# Patient Record
Sex: Female | Born: 2005 | Race: White | Hispanic: Yes | Marital: Single | Smoking: Never smoker
Health system: Southern US, Community
[De-identification: ages and names within clinical notes are randomized; demographics above are authoritative.]

## PROBLEM LIST (undated history)

## (undated) DIAGNOSIS — T7840XA Allergy, unspecified, initial encounter: Secondary | ICD-10-CM

## (undated) DIAGNOSIS — H539 Unspecified visual disturbance: Secondary | ICD-10-CM

## (undated) HISTORY — DX: Allergy, unspecified, initial encounter: T78.40XA

## (undated) HISTORY — DX: Unspecified visual disturbance: H53.9

---
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
## 2024-01-01 DIAGNOSIS — M25531 Pain in right wrist: Secondary | ICD-10-CM | POA: Diagnosis not present

## 2024-01-09 DIAGNOSIS — M25512 Pain in left shoulder: Secondary | ICD-10-CM | POA: Diagnosis not present

## 2024-01-12 DIAGNOSIS — M25512 Pain in left shoulder: Secondary | ICD-10-CM | POA: Diagnosis not present

## 2024-01-26 ENCOUNTER — Encounter: Payer: Self-pay | Admitting: Neurology

## 2024-01-26 ENCOUNTER — Ambulatory Visit (INDEPENDENT_AMBULATORY_CARE_PROVIDER_SITE_OTHER): Payer: 59 | Admitting: Neurology

## 2024-01-26 VITALS — BP 122/74 | HR 61 | Ht 68.0 in | Wt 163.2 lb

## 2024-01-26 DIAGNOSIS — R519 Headache, unspecified: Secondary | ICD-10-CM | POA: Diagnosis not present

## 2024-01-26 DIAGNOSIS — G43709 Chronic migraine without aura, not intractable, without status migrainosus: Secondary | ICD-10-CM | POA: Diagnosis not present

## 2024-01-26 MED ORDER — EPINEPHRINE 0.3 MG/0.3ML IJ SOAJ: 0.3000 mg | INTRAMUSCULAR | 6 refills | Status: DC | PRN

## 2024-01-26 MED ORDER — NURTEC 75 MG PO TBDP: 75.0000 mg | ORAL_TABLET | Freq: Every day | ORAL | Status: DC | PRN

## 2024-01-26 MED ORDER — EMGALITY 120 MG/ML ~~LOC~~ SOAJ
240.0000 mg | Freq: Once | SUBCUTANEOUS | 0 refills | Status: AC
Start: 2024-01-26 — End: 2024-01-26

## 2024-01-26 NOTE — Patient Instructions (Addendum)
 New medications: Emgality monthly (Ajovy, Not Aimovig, Nurtec, Tempie Hoist, Qulipta) Older medications: Include Topiramate, Amitriptyline, propranolol, Sumatriptan, rizatriptan, cymbalta, effexor, candesartan  Galcanezumab Injection What is this medication? GALCANEZUMAB (gal ka NEZ ue mab) prevents migraines. It works by blocking a substance in the body that causes migraines. It may also be used to treat cluster headaches. It is a monoclonal antibody. This medicine may be used for other purposes; ask your health care provider or pharmacist if you have questions. COMMON BRAND NAME(S): Emgality What should I tell my care team before I take this medication? They need to know if you have any of these conditions: An unusual or allergic reaction to galcanezumab, other medications, foods, dyes, or preservatives Pregnant or trying to get pregnant Breast-feeding How should I use this medication? This medication is injected under the skin. You will be taught how to prepare and give it. Take it as directed on the prescription label. Keep taking it unless your care team tells you to stop. It is important that you put your used needles and syringes in a special sharps container. Do not put them in a trash can. If you do not have a sharps container, call your pharmacist or care team to get one. Talk to your care team about the use of this medication in children. Special care may be needed. Overdosage: If you think you have taken too much of this medicine contact a poison control center or emergency room at once. NOTE: This medicine is only for you. Do not share this medicine with others. What if I miss a dose? If you miss a dose, take it as soon as you can. If it is almost time for your next dose, take only that dose. Do not take double or extra doses. What may interact with this medication? Interactions are not expected. This list may not describe all possible interactions. Give your health care  provider a list of all the medicines, herbs, non-prescription drugs, or dietary supplements you use. Also tell them if you smoke, drink alcohol, or use illegal drugs. Some items may interact with your medicine. What should I watch for while using this medication? Visit your care team for regular checks on your progress. Tell your care team if your symptoms do not start to get better or if they get worse. What side effects may I notice from receiving this medication? Side effects that you should report to your care team as soon as possible: Allergic reactions or angioedema--skin rash, itching or hives, swelling of the face, eyes, lips, tongue, arms, or legs, trouble swallowing or breathing Side effects that usually do not require medical attention (report to your care team if they continue or are bothersome): Pain, redness, or irritation at injection site This list may not describe all possible side effects. Call your doctor for medical advice about side effects. You may report side effects to FDA at 1-800-FDA-1088. Where should I keep my medication? Keep out of the reach of children and pets. Store in a refrigerator or at room temperature between 20 and 25 degrees C (68 and 77 degrees F). Refrigeration (preferred): Store in the refrigerator. Do not freeze. Keep in the original container until you are ready to take it. Remove the dose from the carton about 30 minutes before it is time for you to use it. If the dose is not used, it may be stored in original container at room temperature for 7 days. Get rid of any unused medication after the expiration date.  Room Temperature: This medication may be stored at room temperature for up to 7 days. Keep it in the original container. Protect from light until time of use. If it is stored at room temperature, get rid of any unused medication after 7 days or after it expires, whichever is first. To get rid of medications that are no longer needed or have  expired: Take the medication to a medication take-back program. Check with your pharmacy or law enforcement to find a location. If you cannot return the medication, ask your pharmacist or care team how to get rid of this medication safely. NOTE: This sheet is a summary. It may not cover all possible information. If you have questions about this medicine, talk to your doctor, pharmacist, or health care provider.  2024 Elsevier/Gold Standard (2022-01-14 00:00:00)  Rimegepant Disintegrating Tablets What is this medication? RIMEGEPANT (ri ME je pant) prevents and treats migraines. It works by blocking a substance in the body that causes migraines. This medicine may be used for other purposes; ask your health care provider or pharmacist if you have questions. COMMON BRAND NAME(S): NURTEC ODT What should I tell my care team before I take this medication? They need to know if you have any of these conditions: Kidney disease Liver disease An unusual or allergic reaction to rimegepant, other medications, foods, dyes, or preservatives Pregnant or trying to get pregnant Breast-feeding How should I use this medication? Take this medication by mouth. Take it as directed on the prescription label. Leave the tablet in the sealed pack until you are ready to take it. With dry hands, open the pack and gently remove the tablet. If the tablet breaks or crumbles, throw it away. Use a new tablet. Place the tablet in the mouth and allow it to dissolve. Then, swallow it. Do not cut, crush, or chew this medication. You do not need water to take this medication. Talk to your care team about the use of this medication in children. Special care may be needed. Overdosage: If you think you have taken too much of this medicine contact a poison control center or emergency room at once. NOTE: This medicine is only for you. Do not share this medicine with others. What if I miss a dose? This does not apply. This medication is  not for regular use. What may interact with this medication? Certain medications for fungal infections, such as fluconazole, itraconazole Rifampin This list may not describe all possible interactions. Give your health care provider a list of all the medicines, herbs, non-prescription drugs, or dietary supplements you use. Also tell them if you smoke, drink alcohol, or use illegal drugs. Some items may interact with your medicine. What should I watch for while using this medication? Visit your care team for regular checks on your progress. Tell your care team if your symptoms do not start to get better or if they get worse. What side effects may I notice from receiving this medication? Side effects that you should report to your care team as soon as possible: Allergic reactions--skin rash, itching, hives, swelling of the face, lips, tongue, or throat Side effects that usually do not require medical attention (report to your care team if they continue or are bothersome): Nausea Stomach pain This list may not describe all possible side effects. Call your doctor for medical advice about side effects. You may report side effects to FDA at 1-800-FDA-1088. Where should I keep my medication? Keep out of the reach of children and pets.  Store at room temperature between 20 and 25 degrees C (68 and 77 degrees F). Get rid of any unused medication after the expiration date. To get rid of medications that are no longer needed or have expired: Take the medication to a medication take-back program. Check with your pharmacy or law enforcement to find a location. If you cannot return the medication, check the label or package insert to see if the medication should be thrown out in the garbage or flushed down the toilet. If you are not sure, ask your care team. If it is safe to put it in the trash, take the medication out of the container. Mix the medication with cat litter, dirt, coffee grounds, or other unwanted  substance. Seal the mixture in a bag or container. Put it in the trash. NOTE: This sheet is a summary. It may not cover all possible information. If you have questions about this medicine, talk to your doctor, pharmacist, or health care provider.  2024 Elsevier/Gold Standard (2022-01-09 00:00:00)

## 2024-01-26 NOTE — Progress Notes (Unsigned)
 [facility]    Provider:  Dr Lucia Gaskins Requesting Provider: Zola Button, Grayling Congress, * Primary Care Provider:  Zola Button, Grayling Congress, DO  CC:  migraines and headaches  HPI:  Rachel Vargas is a 18 y.o. female here as requested by Zola Button, Grayling Congress, * for Headaches was diagnosed with migraines even before concussion. Daily headaches. The pain can be unilateral behind and eye or in the back of her head, pressure or squeezing, throbbing/pounding, like a pingpong ball, photophobia/phonophobia/osmophobia, worsened after concussion in 08/2022, playing basketball, nausea, hurts to move, a dark quiet room helps, would get car sick in the car. 12 migraine days a month, lasts at least 4-24 hours and can be moderate to severe, sleep helps if she can sleep, no aura, no medication overuse ongoing at this frequency and severity for over a year.has vomited in the past. No other focal neurologic deficits, associated symptoms, inciting events or modifiable factors.He aunt has migraines. No other focal neurologic deficits, associated symptoms, inciting events or modifiable factors.  Reviewed notes, labs and imaging from outside physicians, which showed:  Aimovig and qulipta contraindicated due to severe issues with constipation.   MRI brain: 10/13/2023:  CLINICAL DATA:  Provided history: Concussion without loss of consciousness, subsequent encounter. New daily persistent headache. Head trauma, GCS = 15, severe headache.   EXAM: MRI HEAD WITHOUT CONTRAST   TECHNIQUE: Multiplanar, multiecho pulse sequences of the brain and surrounding structures were obtained without intravenous contrast.   COMPARISON:  None.   FINDINGS: Brain:   Cerebral volume is normal.   No cortical encephalomalacia is identified. No significant cerebral white matter disease.   There is no acute infarct.   No evidence of an intracranial mass.   No chronic intracranial blood products.   No extra-axial  fluid collection.   No midline shift.   Vascular: Maintained flow voids within the proximal large arterial vessels.   Skull and upper cervical spine: No focal worrisome marrow lesion.   Sinuses/Orbits: No mass or acute finding within the imaged orbits. Minimal mucosal thickening, and possible small mucous retention cyst, within the right maxillary sinus.   IMPRESSION: 1. Unremarkable non-contrast MRI appearance of the brain. No evidence of an acute intracranial abnormality. 2. Mild right maxillary sinus disease, as described.     Latest Ref Rng & Units 01/26/2024    9:24 AM 04/11/2016    2:38 PM  CBC  WBC 3.4 - 10.8 x10E3/uL 5.1  7.6   Hemoglobin 11.1 - 15.9 g/dL 16.1  09.6   Hematocrit 34.0 - 46.6 % 46.1  39.0   Platelets 150 - 450 x10E3/uL 291  301       Latest Ref Rng & Units 01/26/2024    9:24 AM 04/11/2016    2:38 PM  CMP  Glucose 70 - 99 mg/dL 80  82   BUN 6 - 20 mg/dL 12  13   Creatinine 0.45 - 1.00 mg/dL 4.09  8.11   Sodium 914 - 144 mmol/L 138  137   Potassium 3.5 - 5.2 mmol/L 4.3  4.2   Chloride 96 - 106 mmol/L 102  105   CO2 20 - 29 mmol/L 22  23   Calcium 8.7 - 10.2 mg/dL 9.6  9.3   Total Protein 6.0 - 8.5 g/dL 7.4  7.0   Total Bilirubin 0.0 - 1.2 mg/dL <7.8  0.4   Alkaline Phos 42 - 106 IU/L 83  209   AST 0 - 40 IU/L 26  29  ALT 0 - 32 IU/L 32  15      Review of Systems: Patient complains of symptoms per HPI as well as the following symptoms none. Pertinent negatives and positives per HPI. All others negative.   Social History   Socioeconomic History   Marital status: Single    Spouse name: Not on file   Number of children: Not on file   Years of education: Not on file   Highest education level: Not on file  Occupational History   Not on file  Tobacco Use   Smoking status: Never   Smokeless tobacco: Never  Vaping Use   Vaping status: Never Used  Substance and Sexual Activity   Alcohol use: No    Alcohol/week: 0.0 standard drinks of  alcohol   Drug use: No   Sexual activity: Never  Other Topics Concern   Not on file  Social History Narrative   Caffiene no   Middle child of 3, llives with parents   Basket ball at [employer]   Social Drivers of Health   Financial Resource Strain: Not on file  Food Insecurity: Not on file  Transportation Needs: Not on file  Physical Activity: Not on file  Stress: Not on file  Social Connections: Unknown (04/12/2022)   Received from [facility], [facility]   Social Network    Social Network: Not on file  Intimate Partner Violence: Unknown (03/05/2022)   [facility] from [facility], [facility]   HITS    Physically Hurt: Not on file    Insult or Talk Down To: Not on file    Threaten Physical Harm: Not on file    Scream or Curse: Not on file    Family History  Problem Relation Age of Onset   Asthma Mother        as a child   Hypertension Maternal Grandmother    Hypertension Maternal Grandfather    Hypertension Paternal Grandfather    Migraines Neg Hx     Past Medical History:  Diagnosis Date   Allergy    Frequent headaches 05/30/2023   Vision abnormalities    Wears corrective lenses    Patient Active Problem List   Diagnosis Date Noted   Abrasion of throat 05/30/2023   Frequent headaches 05/30/2023   Scapular dysfunction 09/10/2021   Right foot injury 12/05/2016   Abdominal pain, epigastric 04/18/2016   Rash and nonspecific skin eruption 04/18/2016   Easy bruising 04/11/2016   Viral URI with cough 08/25/2015   Left wrist pain 06/20/2015   Medial tibial stress syndrome 03/23/2015   Right elbow pain 05/10/2014    History reviewed. No pertinent surgical history.  Current Outpatient Medications  Medication Sig Dispense Refill   acetaminophen (TYLENOL) 500 MG tablet Take 500 mg by mouth every 8 (eight) hours as needed.     EPINEPHrine 0.3 mg/0.3 mL IJ SOAJ injection Inject 0.3 mg into the muscle as needed for anaphylaxis. 2 each 6    Galcanezumab-gnlm (EMGALITY) 120 MG/ML SOAJ Inject 120 mg into the skin every 30 (thirty) days. Plese use copay card: [id number]  PCN PDMI GRP [id number] ID [id number] EXP 12/01/2024 1.12 mL 11   ibuprofen (ADVIL) 200 MG tablet Take 200-400 mg by mouth every 6 (six) hours as needed.     Rimegepant Sulfate (NURTEC) 75 MG TBDP Take 1 tablet (75 mg total) by mouth daily as needed. Take one daily for next 8 days     No current facility-administered medications for this  visit.    Allergies as of 01/26/2024 - Review Complete 01/26/2024  Allergen Reaction Noted   Molds & smuts Other (See Comments) 10/19/2020   Pollen extract Other (See Comments) 10/19/2020   Wheat extract Other (See Comments) 09/23/2022    Vitals: BP 122/74 (Cuff Size: Normal)   Pulse 61   Ht 5\' 8"  (1.727 m)   Wt 163 lb 3.2 oz (74 kg)   BMI 24.81 kg/m  Last Weight:  Wt Readings from Last 1 Encounters:  01/26/24 163 lb 3.2 oz (74 kg) (91%, Z= 1.33)*   * Growth percentiles are based on CDC (Girls, 2-20 Years) data.   Last Height:   Ht Readings from Last 1 Encounters:  01/26/24 5\' 8"  (1.727 m) (93%, Z= 1.48)*   * Growth percentiles are based on CDC (Girls, 2-20 Years) data.     Physical exam: Exam: Gen: NAD, conversant, well nourised, obese, well groomed                     CV: RRR, no MRG. No Carotid Bruits. No peripheral edema, warm, nontender Eyes: Conjunctivae clear without exudates or hemorrhage  Neuro: Detailed Neurologic Exam  Speech:    Speech is normal; fluent and spontaneous with normal comprehension.  Cognition:    The patient is oriented to person, place, and time;     recent and remote memory intact;     language fluent;     normal attention, concentration,     fund of knowledge Cranial Nerves:    The pupils are equal, round, and reactive to light. The fundi are normal and spontaneous venous pulsations are present. Visual fields are full to finger confrontation. Extraocular movements are  intact. Trigeminal sensation is intact and the muscles of mastication are normal. The face is symmetric. The palate elevates in the midline. Hearing intact. Voice is normal. Shoulder shrug is normal. The tongue has normal motion without fasciculations.   Coordination:    Normal finger to nose right arm and heel to shin. Left arm in a sling   Gait:    Heel-toe and tandem gait are normal.   Motor Observation:    No asymmetry, no atrophy, and no involuntary movements noted. Tone:    Normal muscle tone.    Posture:    Posture is normal. normal erect    Strength:    Strength is V/V in the upper and lower limbs. Left arm in a sling     Sensation: intact to LT     Reflex Exam:  DTR's:    Deep tendon reflexes in the upper and lower extremities are normal bilaterally.   Toes:    The toes are downgoing bilaterally.   Clonus:    Clonus is absent.    Assessment/Plan:  Chronic migraines. Start [employer] She has had 2 concussions, discussed risk of permanent concussion symptoms with multiple concussions.   Discussed:  New medications: Emgality monthly (Ajovy, Not Aimovig, Nurtec, Tempie Hoist, Qulipta) Older medications: Include Topiramate, Amitriptyline, propranolol, Sumatriptan, rizatriptan, cymbalta, effexor, candesartan  Orders Placed This Encounter  Procedures   CBC with Differential/Platelets   Comprehensive metabolic panel   TSH Rfx on Abnormal to Free T4   Meds ordered this encounter  Medications   Galcanezumab-gnlm (EMGALITY) 120 MG/ML SOAJ    Sig: Inject 240 mg into the skin once for 1 dose. Loading dose. Please use copay card: [id number]  PCN PDMI GRP [id number] ID [id number] EXP 12/01/2024    Dispense:  2 mL  Refill:  0    Loading dose. Please use copay card: BINM [id number]  PCN PDMI GRP [id number] ID [id number] EXP 12/01/2024   Rimegepant Sulfate (NURTEC) 75 MG TBDP    Sig: Take 1 tablet (75 mg total) by mouth daily as needed. Take one daily for next 8 days    EPINEPHrine 0.3 mg/0.3 mL IJ SOAJ injection    Sig: Inject 0.3 mg into the muscle as needed for anaphylaxis.    Dispense:  2 each    Refill:  6    Please give her longest expiration date she does not use often   Galcanezumab-gnlm (EMGALITY) 120 MG/ML SOAJ    Sig: Inject 120 mg into the skin every 30 (thirty) days. Plese use copay card: [id number] [id number]  PCN PDMI GRP [id number] ID [id number] EXP 12/01/2024    Dispense:  1.12 mL    Refill:  11    BIN [id number]  PCN PDMI GRP [id number] ID [id number] EXP 12/01/2024    Cc: Donato Schultz, *,  Donato Schultz, DO  Naomie Dean, MD  [facility] [address] [place], [place] [address]  Phone [phone] Fax [phone]

## 2024-01-27 ENCOUNTER — Encounter: Payer: Self-pay | Admitting: Neurology

## 2024-01-27 LAB — CBC WITH DIFFERENTIAL/PLATELET
Basophils Absolute: 0 10*3/uL (ref 0.0–0.2)
Basos: 0 %
EOS (ABSOLUTE): 0.1 10*3/uL (ref 0.0–0.4)
Eos: 1 %
Hematocrit: 46.1 % (ref 34.0–46.6)
Hemoglobin: 15 g/dL (ref 11.1–15.9)
Immature Grans (Abs): 0 10*3/uL (ref 0.0–0.1)
Immature Granulocytes: 0 %
Lymphocytes Absolute: 1.7 10*3/uL (ref 0.7–3.1)
Lymphs: 34 %
MCH: 28.7 pg (ref 26.6–33.0)
MCHC: 32.5 g/dL (ref 31.5–35.7)
MCV: 88 fL (ref 79–97)
Monocytes Absolute: 0.5 10*3/uL (ref 0.1–0.9)
Monocytes: 9 %
Neutrophils Absolute: 2.8 10*3/uL (ref 1.4–7.0)
Neutrophils: 56 %
Platelets: 291 10*3/uL (ref 150–450)
RBC: 5.23 x10E6/uL (ref 3.77–5.28)
RDW: 12.2 % (ref 11.7–15.4)
WBC: 5.1 10*3/uL (ref 3.4–10.8)

## 2024-01-27 LAB — COMPREHENSIVE METABOLIC PANEL
ALT: 32 [IU]/L (ref 0–32)
AST: 26 [IU]/L (ref 0–40)
Albumin: 4.7 g/dL (ref 4.0–5.0)
Alkaline Phosphatase: 83 [IU]/L (ref 42–106)
BUN/Creatinine Ratio: 15 (ref 9–23)
BUN: 12 mg/dL (ref 6–20)
Bilirubin Total: <0.2 mg/dL (ref 0.0–1.2)
CO2: 22 mmol/L (ref 20–29)
Calcium: 9.6 mg/dL (ref 8.7–10.2)
Chloride: 102 mmol/L (ref 96–106)
Creatinine, Ser: 0.81 mg/dL (ref 0.57–1.00)
Globulin, Total: 2.7 g/dL (ref 1.5–4.5)
Glucose: 80 mg/dL (ref 70–99)
Potassium: 4.3 mmol/L (ref 3.5–5.2)
Sodium: 138 mmol/L (ref 134–144)
Total Protein: 7.4 g/dL (ref 6.0–8.5)
eGFR: 108 mL/min/{1.73_m2} (ref >59)

## 2024-01-27 LAB — TSH RFX ON ABNORMAL TO FREE T4: TSH: 1.32 u[IU]/mL (ref 0.450–4.500)

## 2024-01-28 MED ORDER — EMGALITY 120 MG/ML ~~LOC~~ SOAJ: 120.0000 mg | SUBCUTANEOUS | 11 refills | Status: DC

## 2024-01-29 DIAGNOSIS — M25312 Other instability, left shoulder: Secondary | ICD-10-CM | POA: Diagnosis not present

## 2024-04-27 DIAGNOSIS — M25312 Other instability, left shoulder: Secondary | ICD-10-CM | POA: Diagnosis not present

## 2024-05-04 NOTE — Patient Instructions (Signed)
 SURGICAL WAITING ROOM VISITATION  Patients having surgery or a procedure may have no more than 2 support people in the waiting area - these visitors may rotate.    Children under the age of 35 must have an adult with them who is not the patient.  Visitors with respiratory illnesses are discouraged from visiting and should remain at home.  If the patient needs to stay at the hospital during part of their recovery, the visitor guidelines for inpatient rooms apply. Pre-op nurse will coordinate an appropriate time for 1 support person to accompany patient in pre-op.  This support person may not rotate.    Please refer to the [facility] website for the visitor guidelines for Inpatients (after your surgery is over and you are in a regular room).       Your procedure is scheduled on: 05/14/24   Report to [facility] Main Entrance    Report to admitting at 10:15  AM   Call this number if you have problems the morning of surgery [phone]   Do not eat food :After Midnight.   After Midnight you may have the following liquids until 9:30 AM DAY OF SURGERY  Water Non-Citrus Juices (without pulp, NO RED-Apple, White grape, White cranberry) Black Coffee (NO MILK/CREAM OR CREAMERS, sugar ok)  Clear Tea (NO MILK/CREAM OR CREAMERS, sugar ok) regular and decaf                             Plain Jell-O (NO RED)                                           Fruit ices (not with fruit pulp, NO RED)                                     Popsicles (NO RED)                                                               Sports drinks like Gatorade (NO RED)                  The day of surgery:  Drink ONE (1) Pre-Surgery Clear Ensure  at 9:30 AM the morning of surgery. Drink in one sitting. Do not sip.  This drink was given to you during your hospital  pre-op appointment visit. Nothing else to drink after completing the  Pre-Surgery Clear Ensure      Oral Hygiene is also important to reduce your  risk of infection.                                    Remember - BRUSH YOUR TEETH THE MORNING OF SURGERY WITH YOUR REGULAR TOOTHPASTE   Stop all vitamins and herbal supplements 7 days before surgery.   Take these medicines the morning of surgery with A SIP OF WATER: Tylenol if needed.             You may  not have any metal on your body including hair pins, jewelry, and body piercing             Do not wear make-up, lotions, powders, perfumes/cologne, or deodorant  Do not wear nail polish including gel and S&S, artificial/acrylic nails, or any other type of covering on natural nails including finger and toenails. If you have artificial nails, gel coating, etc. that needs to be removed by a nail salon please have this removed prior to surgery or surgery may need to be canceled/ delayed if the surgeon/ anesthesia feels like they are unable to be safely monitored.   Do not shave  48 hours prior to surgery.    Do not bring valuables to the hospital. [place] IS NOT             RESPONSIBLE   FOR VALUABLES.   Contacts, glasses, dentures or bridgework may not be worn into surgery.  DO NOT BRING YOUR HOME MEDICATIONS TO THE HOSPITAL. PHARMACY WILL DISPENSE MEDICATIONS LISTED ON YOUR MEDICATION LIST TO YOU DURING YOUR ADMISSION IN THE HOSPITAL!    Patients discharged on the day of surgery will not be allowed to drive home.  Someone NEEDS to stay with you for the first 24 hours after anesthesia.   Special Instructions: Bring a copy of your healthcare power of attorney and living will documents the day of surgery if you haven't scanned them before.              Please read over the following fact sheets you were given: IF YOU HAVE QUESTIONS ABOUT YOUR PRE-OP INSTRUCTIONS PLEASE CALL [phone] Rachel Vargas   If you received a COVID test during your pre-op visit  it is requested that you wear a mask when out in public, stay away from anyone that may not be feeling well and notify your surgeon if you  develop symptoms. If you test positive for Covid or have been in contact with anyone that has tested positive in the last 10 days please notify you surgeon.    [place] - Preparing for Surgery Before surgery, you can play an important role.  Because skin is not sterile, your skin needs to be as free of germs as possible.  You can reduce the number of germs on your skin by washing with CHG (chlorahexidine gluconate) soap before surgery.  CHG is an antiseptic cleaner which kills germs and bonds with the skin to continue killing germs even after washing. Please DO NOT use if you have an allergy to CHG or antibacterial soaps.  If your skin becomes reddened/irritated stop using the CHG and inform your nurse when you arrive at Short Stay. Do not shave (including legs and underarms) for at least 48 hours prior to the first CHG shower.  You may shave your face/neck.  Please follow these instructions carefully:  1.  Shower with CHG Soap the night before surgery and the  morning of surgery.  2.  If you choose to wash your hair, wash your hair first as usual with your normal  shampoo.  3.  After you shampoo, rinse your hair and body thoroughly to remove the shampoo.                             4.  Use CHG as you would any other liquid soap.  You can apply chg directly to the skin and wash.  Gently with a scrungie or clean  washcloth.  5.  Apply the CHG Soap to your body ONLY FROM THE NECK DOWN.   Do   not use on face/ open                           Wound or open sores. Avoid contact with eyes, ears mouth and   genitals (private parts).                       Wash face,  Genitals (private parts) with your normal soap.             6.  Wash thoroughly, paying special attention to the area where your    surgery  will be performed.  7.  Thoroughly rinse your body with warm water from the neck down.  8.  DO NOT shower/wash with your normal soap after using and rinsing off the CHG Soap.                9.  Pat  yourself dry with a clean towel.            10.  Wear clean pajamas.            11.  Place clean sheets on your bed the night of your first shower and do not  sleep with pets. Day of Surgery : Do not apply any lotions/deodorants the morning of surgery.  Please wear clean clothes to the hospital/surgery center.  FAILURE TO FOLLOW THESE INSTRUCTIONS MAY RESULT IN THE CANCELLATION OF YOUR SURGERY  PATIENT SIGNATURE_________________________________  NURSE SIGNATURE__________________________________  ________________________________________________________________________

## 2024-05-04 NOTE — Progress Notes (Signed)
 COVID Vaccine received:  [x]  No []  Yes Date of any COVID positive Test in last 90 days: no PCP - Roel Clarity DO Cardiologist - n/a  Chest x-ray -  EKG -   Stress Test -  ECHO -  Cardiac Cath -   Bowel Prep - [x]  No  []   Yes ______  Pacemaker / ICD device [x]  No []  Yes   Spinal Cord Stimulator:[x]  No []  Yes       History of Sleep Apnea? [x]  No []  Yes   CPAP used?- [x]  No []  Yes    Does the patient monitor blood sugar?          [x]  No []  Yes  []  N/A  Patient has: [x]  NO Hx DM   []  Pre-DM                 []  DM1  []   DM2 Does patient have a [employer] or [employer]? []  No []  Yes   Fasting Blood Sugar Ranges-  Checks Blood Sugar _____ times a day  GLP1 agonist / usual dose - no GLP1 instructions:  SGLT-2 inhibitors / usual dose - no SGLT-2 instructions:   Blood Thinner / Instructions:no Aspirin Instructions:no  Comments:   Activity level: Patient is able to climb a flight of stairs without difficulty; [x]  No CP  [x]  No SOB,    Patient can  perform ADLs without assistance.   Anesthesia review: 2 concussions in past. Daily headaches. Did see Neurology 01/26/24.  Patient denies shortness of breath, fever, cough and chest pain at PAT appointment.  Patient verbalized understanding and agreement to the Pre-Surgical Instructions that were given to them at this PAT appointment. Patient was also educated of the need to review these PAT instructions again prior to his/her surgery.I reviewed the appropriate phone numbers to call if they have any and questions or concerns.

## 2024-05-07 ENCOUNTER — Other Ambulatory Visit: Payer: Self-pay

## 2024-05-07 ENCOUNTER — Telehealth: Payer: Self-pay | Admitting: Neurology

## 2024-05-07 ENCOUNTER — Encounter (HOSPITAL_COMMUNITY)
Admission: RE | Admit: 2024-05-07 | Discharge: 2024-05-07 | Disposition: A | Discharge status: Home / Self Care | Source: Ambulatory Visit | Attending: Orthopedic Surgery | Admitting: Orthopedic Surgery

## 2024-05-07 VITALS — BP 130/82 | HR 57 | Temp 98.2°F | Resp 16 | Ht 68.0 in | Wt 165.0 lb

## 2024-05-07 DIAGNOSIS — F0781 Postconcussional syndrome: Secondary | ICD-10-CM

## 2024-05-07 DIAGNOSIS — Z01818 Encounter for other preprocedural examination: Secondary | ICD-10-CM

## 2024-05-07 DIAGNOSIS — Z01812 Encounter for preprocedural laboratory examination: Secondary | ICD-10-CM | POA: Insufficient documentation

## 2024-05-07 DIAGNOSIS — R519 Headache, unspecified: Secondary | ICD-10-CM

## 2024-05-07 LAB — CBC
HCT: 43.8 % (ref 36.0–46.0)
Hemoglobin: 14.3 g/dL (ref 12.0–15.0)
MCH: 28.3 pg (ref 26.0–34.0)
MCHC: 32.6 g/dL (ref 30.0–36.0)
MCV: 86.7 fL (ref 80.0–100.0)
Platelets: 273 10*3/uL (ref 150–400)
RBC: 5.05 MIL/uL (ref 3.87–5.11)
RDW: 11.7 % (ref 11.5–15.5)
WBC: 5 10*3/uL (ref 4.0–10.5)
nRBC: 0 % (ref 0.0–0.2)

## 2024-05-07 NOTE — Telephone Encounter (Signed)
 Pt mother called in regards to Daughter is having Pre op Surgery  this morning at 9:00 am and will need to use anesthesia and Parent want to know would this effect Daughter .

## 2024-05-10 NOTE — Addendum Note (Signed)
 Addended by: Santana Cue A on: 05/10/2024 04:46 PM   Modules accepted: Orders

## 2024-05-10 NOTE — Telephone Encounter (Signed)
 LVM for mom (checked dpr) call back concerning anesthesia and effects it may have on patient

## 2024-05-10 NOTE — Telephone Encounter (Addendum)
 Spoke to mom (checked DPR) Informed mother per Dr Tresia Fruit please talk to anesthesiologists regarding  side effects of anesthesia . Mom asked about referral for patient post concussion injury Per dr Tresia Fruit will  send referral to [facility] on Aging and Rehabilitation. . Made mom  aware will take 7-14 days busniess days for insurance approval and center will call them for an appointment,mom expressed understanding and thanked me for calling . Forward to [place] [facility] in phone room to send referral for patient

## 2024-05-10 NOTE — Addendum Note (Signed)
 Addended by: Dasie Chancellor B on: 05/10/2024 04:51 PM   Modules accepted: Orders

## 2024-05-11 ENCOUNTER — Telehealth: Payer: Self-pay | Admitting: Neurology

## 2024-05-11 NOTE — Telephone Encounter (Signed)
 Referral for Rehabilitation Faxed  to [facility] on Aging and Rehabilitation   for Post Concussion.  [facility] on Aging and Rehabilitation  Phone # [phone] Faxed # [phone]

## 2024-05-13 NOTE — Telephone Encounter (Signed)
 LVM for dad to call back (checked DPR) Per Dr Tresia Fruit she [facility] her  [facility]  if [facility] knows a [facility] that specializes  in concussion program/concussion clinic of his choice Please let Dr Tresia Fruit know and we will send the referral .

## 2024-05-13 NOTE — Telephone Encounter (Addendum)
 Patient father called in regarding referral to [facility] center. Father is wanting a referral to a more specialized concussion program/concussion clinic. States the [facility] is not really for that? He has looked at other things and thinks she needs something more specifically for concussions to help with her headaches and dizziness that is not therapy specifically? I'm not really sure what exactly he is looking for for patient, but he would like a call back to discuss.

## 2024-05-14 ENCOUNTER — Other Ambulatory Visit: Payer: Self-pay

## 2024-05-14 ENCOUNTER — Ambulatory Visit (HOSPITAL_BASED_OUTPATIENT_CLINIC_OR_DEPARTMENT_OTHER): Admitting: Anesthesiology

## 2024-05-14 ENCOUNTER — Ambulatory Visit (HOSPITAL_COMMUNITY)
Admission: RE | Admit: 2024-05-14 | Discharge: 2024-05-14 | Disposition: A | Discharge status: Home / Self Care | Attending: Orthopedic Surgery | Admitting: Orthopedic Surgery

## 2024-05-14 ENCOUNTER — Encounter (HOSPITAL_COMMUNITY)
Admission: RE | Disposition: A | Discharge status: Home / Self Care | Payer: Self-pay | Source: Home / Self Care | Attending: Orthopedic Surgery

## 2024-05-14 ENCOUNTER — Encounter (HOSPITAL_COMMUNITY): Payer: Self-pay | Admitting: Orthopedic Surgery

## 2024-05-14 ENCOUNTER — Ambulatory Visit (HOSPITAL_COMMUNITY): Payer: Self-pay | Admitting: Physician Assistant

## 2024-05-14 DIAGNOSIS — S43491A Other sprain of right shoulder joint, initial encounter: Secondary | ICD-10-CM | POA: Diagnosis not present

## 2024-05-14 DIAGNOSIS — M25312 Other instability, left shoulder: Secondary | ICD-10-CM | POA: Diagnosis not present

## 2024-05-14 DIAGNOSIS — X58XXXA Exposure to other specified factors, initial encounter: Secondary | ICD-10-CM | POA: Diagnosis not present

## 2024-05-14 DIAGNOSIS — S43431A Superior glenoid labrum lesion of right shoulder, initial encounter: Secondary | ICD-10-CM | POA: Diagnosis not present

## 2024-05-14 DIAGNOSIS — S42292A Other displaced fracture of upper end of left humerus, initial encounter for closed fracture: Secondary | ICD-10-CM | POA: Diagnosis not present

## 2024-05-14 DIAGNOSIS — S43432A Superior glenoid labrum lesion of left shoulder, initial encounter: Secondary | ICD-10-CM | POA: Diagnosis not present

## 2024-05-14 DIAGNOSIS — Z87828 Personal history of other (healed) physical injury and trauma: Secondary | ICD-10-CM | POA: Insufficient documentation

## 2024-05-14 DIAGNOSIS — G8918 Other acute postprocedural pain: Secondary | ICD-10-CM | POA: Diagnosis not present

## 2024-05-14 LAB — POCT PREGNANCY, URINE: Preg Test, Ur: NEGATIVE

## 2024-05-14 SURGERY — REPAIR, SHOULDER, ARTHROSCOPIC, BANKART
Anesthesia: General | Site: Shoulder | Laterality: Left

## 2024-05-14 MED ORDER — EPINEPHRINE PF 1 MG/ML IJ SOLN
INTRAMUSCULAR | Status: AC
  Filled 2024-05-14: qty 1

## 2024-05-14 MED ORDER — OXYCODONE HCL 5 MG PO TABS: 5.0000 mg | ORAL_TABLET | Freq: Once | ORAL | Status: DC | PRN

## 2024-05-14 MED ORDER — OXYCODONE HCL 5 MG PO TABS: 5.0000 mg | ORAL_TABLET | ORAL | 0 refills | Status: AC | PRN

## 2024-05-14 MED ORDER — BUPIVACAINE HCL (PF) 0.5 % IJ SOLN
INTRAMUSCULAR | Status: DC | PRN
  Administered 2024-05-14: 15 mL via PERINEURAL

## 2024-05-14 MED ORDER — SUGAMMADEX SODIUM 200 MG/2ML IV SOLN
INTRAVENOUS | Status: DC | PRN
  Administered 2024-05-14: 200 mg via INTRAVENOUS

## 2024-05-14 MED ORDER — LACTATED RINGERS IV SOLN: INTRAVENOUS | Status: DC

## 2024-05-14 MED ORDER — DROPERIDOL 2.5 MG/ML IJ SOLN: 0.6250 mg | Freq: Once | INTRAMUSCULAR | Status: DC | PRN

## 2024-05-14 MED ORDER — PROPOFOL 10 MG/ML IV BOLUS
INTRAVENOUS | Status: AC
  Filled 2024-05-14: qty 20

## 2024-05-14 MED ORDER — ROCURONIUM BROMIDE 10 MG/ML (PF) SYRINGE
PREFILLED_SYRINGE | INTRAVENOUS | Status: AC
  Filled 2024-05-14: qty 10

## 2024-05-14 MED ORDER — ONDANSETRON 4 MG PO TBDP: 4.0000 mg | ORAL_TABLET | Freq: Three times a day (TID) | ORAL | 0 refills | Status: AC | PRN

## 2024-05-14 MED ORDER — ONDANSETRON HCL 4 MG/2ML IJ SOLN: 4.0000 mg | Freq: Once | INTRAMUSCULAR | Status: DC | PRN

## 2024-05-14 MED ORDER — CEFAZOLIN SODIUM-DEXTROSE 2-4 GM/100ML-% IV SOLN
2.0000 g | INTRAVENOUS | Status: AC
  Administered 2024-05-14: 2 g via INTRAVENOUS
  Filled 2024-05-14: qty 100

## 2024-05-14 MED ORDER — LIDOCAINE HCL (CARDIAC) PF 100 MG/5ML IV SOSY
PREFILLED_SYRINGE | INTRAVENOUS | Status: DC | PRN
  Administered 2024-05-14: 60 mg via INTRAVENOUS

## 2024-05-14 MED ORDER — HYDROMORPHONE HCL 1 MG/ML IJ SOLN: 0.2500 mg | INTRAMUSCULAR | Status: DC | PRN

## 2024-05-14 MED ORDER — FENTANYL CITRATE (PF) 100 MCG/2ML IJ SOLN
INTRAMUSCULAR | Status: DC | PRN
  Administered 2024-05-14: 100 ug via INTRAVENOUS

## 2024-05-14 MED ORDER — ROCURONIUM BROMIDE 100 MG/10ML IV SOLN
INTRAVENOUS | Status: DC | PRN
Start: 2024-05-14 — End: 2024-05-14
  Administered 2024-05-14: 60 mg via INTRAVENOUS

## 2024-05-14 MED ORDER — FENTANYL CITRATE PF 50 MCG/ML IJ SOSY
50.0000 ug | PREFILLED_SYRINGE | INTRAMUSCULAR | Status: DC
  Administered 2024-05-14: 50 ug via INTRAVENOUS
  Filled 2024-05-14: qty 2

## 2024-05-14 MED ORDER — CHLORHEXIDINE GLUCONATE 0.12 % MT SOLN
15.0000 mL | Freq: Once | OROMUCOSAL | Status: AC
  Administered 2024-05-14: 15 mL via OROMUCOSAL

## 2024-05-14 MED ORDER — PROPOFOL 10 MG/ML IV BOLUS
INTRAVENOUS | Status: DC | PRN
  Administered 2024-05-14: 180 mg via INTRAVENOUS

## 2024-05-14 MED ORDER — EPINEPHRINE 1 MG/ML IJ SOLN
INTRAMUSCULAR | Status: DC | PRN
  Administered 2024-05-14: 1 mg

## 2024-05-14 MED ORDER — FENTANYL CITRATE (PF) 100 MCG/2ML IJ SOLN
INTRAMUSCULAR | Status: AC
Start: 2024-05-14 — End: 2024-05-14
  Filled 2024-05-14: qty 2

## 2024-05-14 MED ORDER — KETOROLAC TROMETHAMINE 30 MG/ML IJ SOLN
INTRAMUSCULAR | Status: AC
  Filled 2024-05-14: qty 1

## 2024-05-14 MED ORDER — ORAL CARE MOUTH RINSE: 15.0000 mL | Freq: Once | OROMUCOSAL | Status: AC

## 2024-05-14 MED ORDER — MIDAZOLAM HCL 2 MG/2ML IJ SOLN
INTRAMUSCULAR | Status: AC
  Filled 2024-05-14: qty 2

## 2024-05-14 MED ORDER — KETOROLAC TROMETHAMINE 15 MG/ML IJ SOLN
INTRAMUSCULAR | Status: DC | PRN
  Administered 2024-05-14: 15 mg via INTRAVENOUS

## 2024-05-14 MED ORDER — BUPIVACAINE LIPOSOME 1.3 % IJ SUSP
INTRAMUSCULAR | Status: DC | PRN
  Administered 2024-05-14: 10 mL via PERINEURAL

## 2024-05-14 MED ORDER — OXYCODONE HCL 5 MG/5ML PO SOLN: 5.0000 mg | Freq: Once | ORAL | Status: DC | PRN

## 2024-05-14 MED ORDER — LIDOCAINE HCL (PF) 2 % IJ SOLN
INTRAMUSCULAR | Status: AC
  Filled 2024-05-14: qty 5

## 2024-05-14 MED ORDER — SODIUM CHLORIDE 0.9 % IR SOLN
Status: DC | PRN
  Administered 2024-05-14: 6000 mL

## 2024-05-14 MED ORDER — ONDANSETRON HCL 4 MG/2ML IJ SOLN
INTRAMUSCULAR | Status: DC | PRN
Start: 2024-05-14 — End: 2024-05-14
  Administered 2024-05-14: 4 mg via INTRAVENOUS

## 2024-05-14 MED ORDER — MIDAZOLAM HCL 2 MG/2ML IJ SOLN
1.0000 mg | INTRAMUSCULAR | Status: DC
  Administered 2024-05-14 (×2): 1 mg via INTRAVENOUS
  Filled 2024-05-14: qty 2

## 2024-05-14 MED ORDER — DEXMEDETOMIDINE HCL IN NACL 80 MCG/20ML IV SOLN
INTRAVENOUS | Status: DC | PRN
  Administered 2024-05-14: 10 ug via INTRAVENOUS

## 2024-05-14 SURGICAL SUPPLY — 58 items
ANCHOR SUT 1.8 FIBERTAK SB KL (Anchor) IMPLANT
ANCHOR SUT FBRTK 2.6 SP #5 (Anchor) IMPLANT
BAG COUNTER SPONGE SURGICOUNT (BAG) ×1 IMPLANT
BLADE SHAVER TORPEDO 4X13 (MISCELLANEOUS) IMPLANT
BURR OVAL 8 FLU 4.0X13 (MISCELLANEOUS) ×1 IMPLANT
CANNULA 5.75X7 CRYSTAL CLEAR (CANNULA) IMPLANT
CANNULA 5.75X71 LONG (CANNULA) IMPLANT
CANNULA TWIST IN 8.25X7CM (CANNULA) IMPLANT
CONNECTOR 5 IN 1 STRAIGHT STRL (MISCELLANEOUS) IMPLANT
COVER FOOTSWITCH UNIV (MISCELLANEOUS) IMPLANT
DISSECTOR 3.8MM X 13CM (MISCELLANEOUS) IMPLANT
DRAPE INCISE IOBAN 66X45 STRL (DRAPES) IMPLANT
DRAPE SHEET LG 3/4 BI-LAMINATE (DRAPES) ×1 IMPLANT
DRAPE STERI 35X30 U-POUCH (DRAPES) ×1 IMPLANT
DRAPE SURG 17X23 STRL (DRAPES) ×1 IMPLANT
DRAPE SURG ORHT 6 SPLT 77X108 (DRAPES) ×2 IMPLANT
DRAPE U-SHAPE 47X51 STRL (DRAPES) ×1 IMPLANT
DURAPREP 26ML APPLICATOR (WOUND CARE) ×1 IMPLANT
ELECT PENCIL ROCKER SW 15FT (MISCELLANEOUS) ×1 IMPLANT
ELECT REM PT RETURN 15FT ADLT (MISCELLANEOUS) IMPLANT
FIBERSTICK 2 (SUTURE) IMPLANT
FILTER STRAW (MISCELLANEOUS) IMPLANT
GAUZE PAD ABD 8X10 STRL (GAUZE/BANDAGES/DRESSINGS) ×3 IMPLANT
GAUZE SPONGE 4X4 12PLY STRL (GAUZE/BANDAGES/DRESSINGS) ×1 IMPLANT
GLOVE BIO SURGEON STRL SZ7.5 (GLOVE) ×2 IMPLANT
GLOVE BIOGEL PI IND STRL 8 (GLOVE) ×2 IMPLANT
GOWN STRL REUS W/ TWL XL LVL3 (GOWN DISPOSABLE) ×2 IMPLANT
KIT ANCHOR FBRTK 2.6 STR (KITS) IMPLANT
KIT BASIN OR (CUSTOM PROCEDURE TRAY) ×1 IMPLANT
KIT CVD SPEAR FBRTK 1.8 DRILL (KITS) IMPLANT
KIT TURNOVER KIT A (KITS) ×1 IMPLANT
LASSO 90 CVE QUICKPAS (DISPOSABLE) IMPLANT
MANIFOLD NEPTUNE II (INSTRUMENTS) ×1 IMPLANT
NDL 1/2 CIR CATGUT .05X1.09 (NEEDLE) IMPLANT
NDL HD SCORPION MEGA LOADER (NEEDLE) IMPLANT
NDL SAFETY ECLIPSE 18X1.5 (NEEDLE) IMPLANT
NEEDLE 1/2 CIR CATGUT .05X1.09 (NEEDLE) IMPLANT
PACK ARTHROSCOPY WL (CUSTOM PROCEDURE TRAY) ×1 IMPLANT
PROBE BIPOLAR ATHRO 135MM 90D (MISCELLANEOUS) IMPLANT
SLEEVE ARM SUSPENSION SYSTEM (MISCELLANEOUS) ×1 IMPLANT
SLING S3 LATERAL DISP (MISCELLANEOUS) ×1 IMPLANT
SLING ULTRA II L (ORTHOPEDIC SUPPLIES) IMPLANT
SLING ULTRA III MED (ORTHOPEDIC SUPPLIES) IMPLANT
SPONGE T-LAP 4X18 ~~LOC~~+RFID (SPONGE) IMPLANT
STRIP CLOSURE SKIN 1/2X4 (GAUZE/BANDAGES/DRESSINGS) IMPLANT
SUT MNCRL AB 3-0 PS2 27 (SUTURE) ×1 IMPLANT
SUT PDS AB 0 CT1 36 (SUTURE) IMPLANT
SUT TIGER TAPE 7 IN WHITE (SUTURE) IMPLANT
SUT VIC AB 0 CT1 36 (SUTURE) ×1 IMPLANT
SUT VIC AB 2-0 CT1 TAPERPNT 27 (SUTURE) IMPLANT
SUTURE FIBERWR #2 38 T-5 BLUE (SUTURE) IMPLANT
SYR 27GX1/2 1ML LL SAFETY (SYRINGE) IMPLANT
TAPE CLOTH SURG 6X10 NS LF (GAUZE/BANDAGES/DRESSINGS) ×1 IMPLANT
TOWEL OR 17X26 10 PK STRL BLUE (TOWEL DISPOSABLE) ×1 IMPLANT
TUBING ARTHROSCOPY IRRIG 16FT (MISCELLANEOUS) ×2 IMPLANT
TUBING CONNECTING 10 (TUBING) ×2 IMPLANT
WAND ABLATOR APOLLO I90 (BUR) ×1 IMPLANT
WATER STERILE IRR 500ML POUR (IV SOLUTION) ×1 IMPLANT

## 2024-05-14 NOTE — Transfer of Care (Signed)
 Immediate Anesthesia Transfer of Care Note  Patient: Rachel Vargas  Procedure(s) Performed: REPAIR, SHOULDER, ARTHROSCOPIC, BANKART, REMPLISSAGE (Left: Shoulder)  Patient Location: PACU  Anesthesia Type:General  Level of Consciousness: drowsy  Airway & Oxygen Therapy: Patient Spontanous Breathing and Patient connected to nasal cannula oxygen  Post-op Assessment: Report given to RN and Post -op Vital signs reviewed and stable  Post vital signs: Reviewed and stable  Last Vitals:  Vitals Value Taken Time  BP 116/63 05/14/24 14:15  Temp 36.2 C 05/14/24 14:11  Pulse 91 05/14/24 14:26  Resp 20 05/14/24 14:26  SpO2 100 % 05/14/24 14:26  Vitals shown include unfiled device data.  Last Pain:  Vitals:   05/14/24 1411  TempSrc:   PainSc: 0-No pain         Complications: No notable events documented.

## 2024-05-14 NOTE — Anesthesia Procedure Notes (Signed)
 Anesthesia Regional Block: Interscalene brachial plexus block   Pre-Anesthetic Checklist: , timeout performed,  Correct Patient, Correct Site, Correct Laterality,  Correct Procedure, Correct Position, site marked,  Risks and benefits discussed,  Surgical consent,  Pre-op evaluation,  At surgeon's request and post-op pain management  Laterality: Left  Prep: chloraprep       Needles:  Injection technique: Single-shot  Needle Type: Echogenic Stimulator Needle     Needle Length: 10cm  Needle Gauge: 21   Needle insertion depth: 6 cm   Additional Needles:   Procedures:,,,, ultrasound used (permanent image in chart),,   Motor weakness within 5 minutes.  Narrative:  Start time: 05/14/2024 11:33 AM End time: 05/14/2024 11:38 AM Injection made incrementally with aspirations every 5 mL.  Performed by: Personally  Anesthesiologist: Tura Gaines, MD  Additional Notes: Timeout performed. Patient sedated. Relevant anatomy ID'd using US . Incremental 2-5ml injection of LA with frequent aspiration. Patient tolerated procedure well.

## 2024-05-14 NOTE — Anesthesia Procedure Notes (Addendum)
 Procedure Name: Intubation Date/Time: 05/14/2024 12:46 PM  Performed by: Linard Reno, CRNAPre-anesthesia Checklist: Patient identified, Emergency Drugs available, Suction available and Patient being monitored Patient Re-evaluated:Patient Re-evaluated prior to induction Oxygen Delivery Method: Circle System Utilized Preoxygenation: Pre-oxygenation with 100% oxygen Induction Type: IV induction Ventilation: Mask ventilation without difficulty Laryngoscope Size: Miller and 2 Grade View: Grade I Tube type: Oral Tube size: 7.0 mm Number of attempts: 1 Airway Equipment and Method: Stylet and Oral airway Placement Confirmation: ETT inserted through vocal cords under direct vision, positive ETCO2 and breath sounds checked- equal and bilateral Secured at: 21 cm Tube secured with: Tape Dental Injury: Teeth and Oropharynx as per pre-operative assessment

## 2024-05-14 NOTE — Discharge Instructions (Addendum)
Orthopedic surgery discharge instructions:  -Maintain postoperative bandages for 3 days.  You may remove these bandages on post op day 3 and begin showering at that time.  Please do not submerge underwater.  -Maintain your arm in sling at all times.  You should only remove for showering and getting dressed.  No lifting with the operative arm.  -For mild to moderate pain use Tylenol and Advil in alternating fashion around-the-clock.  For breakthrough pain use oxycodone as necessary.  -Please apply ice to the right shoulder for 20-30 minutes out of each hour that you are awake.  Do this around-the-clock for the first 3 days from surgery.  -Follow-up in 2 weeks for routine postoperative check.  

## 2024-05-14 NOTE — Anesthesia Preprocedure Evaluation (Addendum)
 Anesthesia Evaluation  Patient identified by MRN, date of birth, ID band Patient awake    Reviewed: Allergy & Precautions, NPO status , Patient's Chart, lab work & pertinent test results  Airway Mallampati: I       Dental no notable dental hx. (+) Teeth Intact, Dental Advisory Given   Pulmonary neg pulmonary ROS   Pulmonary exam normal breath sounds clear to auscultation       Cardiovascular negative cardio ROS Normal cardiovascular exam Rhythm:Regular Rate:Normal     Neuro/Psych  Headaches Hx/o concussion last year with HA's since that time. MRI negative for intracranial pathology 10/13/23  negative psych ROS   GI/Hepatic negative GI ROS, Neg liver ROS,,,  Endo/Other  negative endocrine ROS    Renal/GU   negative genitourinary   Musculoskeletal Left shoulder instability    Abdominal   Peds  Hematology negative hematology ROS (+) MTHFR gene mutation   Anesthesia Other Findings   Reproductive/Obstetrics negative OB ROS                              Anesthesia Physical Anesthesia Plan  ASA: 1  Anesthesia Plan: General   Post-op Pain Management: Regional block* and Minimal or no pain anticipated   Induction: Intravenous  PONV Risk Score and Plan: 4 or greater and Treatment may vary due to age or medical condition, Ondansetron and Dexamethasone  Airway Management Planned: Oral ETT  Additional Equipment: None  Intra-op Plan:   Post-operative Plan: Extubation in OR  Informed Consent: I have reviewed the patients History and Physical, chart, labs and discussed the procedure including the risks, benefits and alternatives for the proposed anesthesia with the patient or authorized representative who has indicated his/her understanding and acceptance.     Dental advisory given  Plan Discussed with: CRNA and Anesthesiologist  Anesthesia Plan Comments:          Anesthesia  Quick Evaluation

## 2024-05-14 NOTE — H&P (Signed)
 ORTHOPAEDIC H and P  REQUESTING PHYSICIAN: Janeth Medicus, MD  PCP:  Crecencio Dodge, Candida Chalk, DO  Chief Complaint: Left shoulder instability  HPI: Rachel Vargas is a 18 y.o. female who complains of left shoulder chronic instability here today for arthroscopic repair.  No new complaints.  Past Medical History:  Diagnosis Date   Allergy    Frequent headaches 05/30/2023   Vision abnormalities    Wears corrective lenses   History reviewed. No pertinent surgical history. Social History   Socioeconomic History   Marital status: Single    Spouse name: Not on file   Number of children: Not on file   Years of education: Not on file   Highest education level: Not on file  Occupational History   Not on file  Tobacco Use   Smoking status: Never   Smokeless tobacco: Never  Vaping Use   Vaping status: Never Used  Substance and Sexual Activity   Alcohol use: No    Alcohol/week: 0.0 standard drinks of alcohol   Drug use: No   Sexual activity: Never  Other Topics Concern   Not on file  Social History Narrative   Caffiene no   Middle child of 3, llives with parents   Basket ball at [employer]   Social Drivers of Health   Financial Resource Strain: Not on file  Food Insecurity: Not on file  Transportation Needs: Not on file  Physical Activity: Not on file  Stress: Not on file  Social Connections: Unknown (04/12/2022)   Received from [employer]   Social Network    Social Network: Not on file   Family History  Problem Relation Age of Onset   Asthma Mother        as a child   Hypertension Maternal Grandmother    Hypertension Maternal Grandfather    Hypertension Paternal Grandfather    Migraines Neg Hx    Allergies  Allergen Reactions   Molds & Smuts Other (See Comments)    Per mother   Pollen Extract Other (See Comments)    Per mother   Wheat Extract Other (See Comments)   Prior to Admission medications   Medication Sig Start Date End Date  Taking? Authorizing Provider  acetaminophen (TYLENOL) 500 MG tablet Take 500 mg by mouth every 6 (six) hours as needed (pain).   Yes [provider]  clindamycin (CLINDAGEL) 1 % gel Apply 1 Application topically in the morning.   Yes [provider]  tretinoin (RETIN-A) 0.025 % cream Apply 1 application  topically at bedtime.   Yes [provider]  ibuprofen  (ADVIL ) 200 MG tablet Take 200-400 mg by mouth every 8 (eight) hours as needed (pain/headache.).    [provider]   No results found.  Positive ROS: All other systems have been reviewed and were otherwise negative with the exception of those mentioned in the HPI and as above.  Physical Exam: General: Alert, no acute distress Cardiovascular: No pedal edema Respiratory: No cyanosis, no use of accessory musculature GI: No organomegaly, abdomen is soft and non-tender Skin: No lesions in the area of chief complaint Neurologic: Sensation intact distally Psychiatric: Patient is competent for consent with normal mood and affect Lymphatic: No axillary or cervical lymphadenopathy  MUSCULOSKELETAL: Left upper extremity is warm and well-perfused with no open wounds or lesions.  Assessment: 1.  Left shoulder chronic anterior instability 2.  Left shoulder Bankart tear 3.  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  We discussed moving ahead with arthroscopic Bankart repair as well as remplissage for arthroscopic stabilization.  Discussed the risk and benefits of the procedure which include but are not limited to bleeding, infection, damage to surrounding nerves and vessels, stiffness, failure of repairs, need for future surgery, development of arthritis, as well as the risk of anesthesia.  She has provided informed consent.  Discharge home postop from PACU.    Janeth Medicus, MD Cell [phone]     05/14/2024 11:15 AM

## 2024-05-14 NOTE — Brief Op Note (Signed)
 05/14/2024  1:53 PM  PATIENT:  Poonam Woehrle  18 y.o. female  PRE-OPERATIVE DIAGNOSIS:  Left shoulder instability  POST-OPERATIVE DIAGNOSIS:  Left shoulder instability  PROCEDURE:  Procedure(s): REPAIR, SHOULDER, ARTHROSCOPIC, BANKART, REMPLISSAGE (Left)  SURGEON:  Surgeons and Role:    * Janeth Medicus, MD - Primary  PHYSICIAN ASSISTANT: Kevan mcClung, PA-C    ANESTHESIA:   regional and general  EBL:  20 cc  BLOOD ADMINISTERED:none  DRAINS: none   LOCAL MEDICATIONS USED:  NONE  SPECIMEN:  No Specimen  DISPOSITION OF SPECIMEN:  N/A  COUNTS:  YES  TOURNIQUET:  * No tourniquets in log *  DICTATION: .Note written in EPIC  PLAN OF CARE: Discharge to home after PACU  PATIENT DISPOSITION:  PACU - hemodynamically stable.   Delay start of Pharmacological VTE agent (>24hrs) due to surgical blood loss or risk of bleeding: not applicable

## 2024-05-14 NOTE — Anesthesia Postprocedure Evaluation (Signed)
 Anesthesia Post Note  Patient: Rachel Vargas  Procedure(s) Performed: REPAIR, SHOULDER, ARTHROSCOPIC, BANKART, REMPLISSAGE (Left: Shoulder)     Patient location during evaluation: PACU Anesthesia Type: General Level of consciousness: awake and alert and oriented Pain management: pain level controlled Vital Signs Assessment: post-procedure vital signs reviewed and stable Respiratory status: spontaneous breathing, nonlabored ventilation and respiratory function stable Cardiovascular status: blood pressure returned to baseline and stable Postop Assessment: no apparent nausea or vomiting Anesthetic complications: no   No notable events documented.  Last Vitals:  Vitals:   05/14/24 1445 05/14/24 1502  BP: 120/64 (P) 125/74  Pulse: 64 (P) 67  Resp: 12 (P) 20  Temp:  (P) 36.6 C  SpO2: 98% (P) 98%    Last Pain:  Vitals:   05/14/24 1502  TempSrc:   PainSc: (P) 0-No pain                 Christol Thetford A.

## 2024-05-14 NOTE — Op Note (Signed)
 05/14/2024     PATIENT:  Rachel Vargas     PRE-OPERATIVE DIAGNOSIS:  Left shoulder instability   POST-OPERATIVE DIAGNOSIS:  1.  Left shoulder instability, anterior traumatic 2.  Left shoulder Bankart tear 3.  Left shoulder posterior humeral head Hill-Sachs lesion 4.  Left shoulder type II SLAP tear   PROCEDURE:   1.  Left shoulder arthroscopic remplissage (rotator cuff tenodesis) 2.  Left shoulder arthroscopic Bankart repair 3.  Left shoulder arthroscopic SLAP repair   SURGEON:  Janeth Medicus, MD   PHYSICIAN ASSISTANT: Karyl Paget, PA-C   Assistant attestation:   PA Mcclung present and scrubbed for the entire procedure.   ANESTHESIA:   General plus interscalene   ESTIMATED BLOOD LOSS: 20cc   PREOPERATIVE INDICATIONS:  Rachel Vargas is a 18 yo female with a diagnosis of Left shoulder instability who failed conservative measures and elected for surgical management.  She has had 3 anterior dislocation episodes of the left shoulder.   The risks benefits and alternatives were discussed with the patient preoperatively including but not limited to the risks of infection, bleeding, nerve injury, cardiopulmonary complications, the need for revision surgery, among others, and the patient was willing to proceed.   OPERATIVE IMPLANTS:  Arthrex 2.6 mm knotless fiber tack x 2.   Arthrex 1.8 mm knotless fiber tack x 4 for Bankart repair   Arthrex 1.8 mm knotless fiber tack x 1 for SLAP repair   OPERATIVE FINDINGS: Broad and wide Hill-Sachs lesion noted that appeared chronic in nature on the posterior aspect of the humeral head.  This was an on track lesion.  She also have clear anterior-inferior Bankart labral tearing as well as extension to about the 12 o'clock position of this left shoulder consistent with SLAP tear.  No Bone loss anterior inferior glenoid.       OPERATIVE PROCEDURE: The patient was brought to the operating room and placed in the supine  position. General anesthesia was administered. IV antibiotics were given. General anesthesia was administered.    The upper extremity was examined and found to be grossly unstable particularly to anterior testing. The upper extremity was prepped and draped in the usual sterile fashion. The patient was in a semilateral decubitus position.  Time out was performed. Diagnostic arthroscopy was carried out the above-named findings.    I placed 2 anterior cannulas, one just off the superior boarder of the subscapularis and one in the superolateral aspect of the rotator interval utilizing spinal needle localization, and then mobilized the labrum off of the medial neck of the glenoid with the spatula.  I then prepared the neck of the glenoid with a shaver/rasp to optimize healing, while still preserving the anterior bone stock.  The labrum had excellent mobility.      We first began with the remplissage anchor placement for later tenodesis at the conclusion of the labrum repair.  While viewing from the anterior superolateral portal and working from the posterior portal percutaneous fashion we introduced 2 separate 2.6 mm knotless fiber tack anchors into the Hill-Sachs defect.  Prior to doing so, we biologically prepared the bony bed of the Hill-Sachs lesion with motorized shaver and bur.  Next, we placed the anchors 1 superior into the Hill-Sachs lesion through the infraspinatus tendon, and then through a separate location more inferior through the infraspinatus another knotless 2.6 mm fiber tack was placed.  These were snapped in place and with later be secured into the Hill-Sachs bed  at the conclusion of the case.   We then turned our attention back to the anterior inferior labrum and the Bankart tear.  This had previously been biologically prepared with motorized shaver as well as freer elevator to ensure good reduction of labrum.  We began at the 6 o'clock position with our first 1.8 mm knotless fiber tack.  We  utilized there antegrade suture lasso to perform a good capsulorrhaphy as well as Bankart labrum repair.  We worked from the inferior position to create a good [place] shift as well as Chad.  We then used a knotless mechanism to cinch this back to bone.  This step was repeated at the 7:00, 8:00, 9:00 oclock positions.  This created an excellent anterior bumper.     Next, we probed the superior labral anchor of the biceps and found this to be quite unstable.  There was patulous labrum there.  We trimmed some of this could have noted rolloff of the superior labrum.  Thus we have a separate incision we percutaneously placed an anchor right at the 11 o'clock position.  We then used the same 1.8 mm knotless fiber tack anchor in this position.  We passed with a suture lasso through the labrum placed biceps anchor, and then secured this with the mechanism.   Lastly, went back to the remplissage and completed the tenodesis while passing the working suture from each respective anchor through to the knotless mechanism of the other such that we created a double pulley technique to compress tendon through the Hill-Sachs bone bed.   Excellent soft tissue restoration of tension was achieved, restoring the labrum bumper, and the humeral head was noted to be centered on the glenoi , and the arthroscopic cannulas were removed, and the portals closed with Monocryl followed by Steri-Strips and sterile gauze. The patient was awakened and returned to the PACU in stable and satisfactory condition. There were no complications and the patient tolerated the procedure well.  All counts were correct.   Disposition:  The patient will be nonweightbearing with an abduction sling to the operative extremity.  She may begin scapular retractions and elbow hand and wrist range motion as tolerated.  She will begin physical therapy in 1 week.  I will see them back in the office in 2 weeks for a wound check.

## 2024-05-17 ENCOUNTER — Encounter (HOSPITAL_COMMUNITY): Payer: Self-pay | Admitting: Orthopedic Surgery

## 2024-05-20 NOTE — Telephone Encounter (Signed)
 Pt father  was returning call  . Informed father that Nurse was reaching out to see if he had any other facilities  for pt referral to be sent to . Father states he  has two places he will call  them and give us  a call back

## 2024-08-11 DIAGNOSIS — R531 Weakness: Secondary | ICD-10-CM | POA: Diagnosis not present

## 2024-08-11 DIAGNOSIS — M75122 Complete rotator cuff tear or rupture of left shoulder, not specified as traumatic: Secondary | ICD-10-CM | POA: Diagnosis not present

## 2024-08-16 DIAGNOSIS — M75122 Complete rotator cuff tear or rupture of left shoulder, not specified as traumatic: Secondary | ICD-10-CM | POA: Diagnosis not present

## 2024-08-16 DIAGNOSIS — R531 Weakness: Secondary | ICD-10-CM | POA: Diagnosis not present

## 2024-08-18 DIAGNOSIS — R531 Weakness: Secondary | ICD-10-CM | POA: Diagnosis not present

## 2024-08-18 DIAGNOSIS — M75122 Complete rotator cuff tear or rupture of left shoulder, not specified as traumatic: Secondary | ICD-10-CM | POA: Diagnosis not present

## 2024-08-23 DIAGNOSIS — M75122 Complete rotator cuff tear or rupture of left shoulder, not specified as traumatic: Secondary | ICD-10-CM | POA: Diagnosis not present

## 2024-08-23 DIAGNOSIS — R531 Weakness: Secondary | ICD-10-CM | POA: Diagnosis not present

## 2024-08-25 DIAGNOSIS — R531 Weakness: Secondary | ICD-10-CM | POA: Diagnosis not present

## 2024-08-25 DIAGNOSIS — M75122 Complete rotator cuff tear or rupture of left shoulder, not specified as traumatic: Secondary | ICD-10-CM | POA: Diagnosis not present

## 2024-08-30 DIAGNOSIS — M75122 Complete rotator cuff tear or rupture of left shoulder, not specified as traumatic: Secondary | ICD-10-CM | POA: Diagnosis not present

## 2024-08-30 DIAGNOSIS — R531 Weakness: Secondary | ICD-10-CM | POA: Diagnosis not present

## 2024-09-01 DIAGNOSIS — R531 Weakness: Secondary | ICD-10-CM | POA: Diagnosis not present

## 2024-09-01 DIAGNOSIS — M75122 Complete rotator cuff tear or rupture of left shoulder, not specified as traumatic: Secondary | ICD-10-CM | POA: Diagnosis not present

## 2024-09-06 DIAGNOSIS — M75122 Complete rotator cuff tear or rupture of left shoulder, not specified as traumatic: Secondary | ICD-10-CM | POA: Diagnosis not present

## 2024-09-06 DIAGNOSIS — R531 Weakness: Secondary | ICD-10-CM | POA: Diagnosis not present

## 2024-09-08 DIAGNOSIS — M75122 Complete rotator cuff tear or rupture of left shoulder, not specified as traumatic: Secondary | ICD-10-CM | POA: Diagnosis not present

## 2024-09-08 DIAGNOSIS — R531 Weakness: Secondary | ICD-10-CM | POA: Diagnosis not present

## 2024-09-15 DIAGNOSIS — M75122 Complete rotator cuff tear or rupture of left shoulder, not specified as traumatic: Secondary | ICD-10-CM | POA: Diagnosis not present

## 2024-09-15 DIAGNOSIS — R531 Weakness: Secondary | ICD-10-CM | POA: Diagnosis not present

## 2024-09-21 DIAGNOSIS — M75122 Complete rotator cuff tear or rupture of left shoulder, not specified as traumatic: Secondary | ICD-10-CM | POA: Diagnosis not present

## 2024-09-21 DIAGNOSIS — R531 Weakness: Secondary | ICD-10-CM | POA: Diagnosis not present

## 2024-09-30 DIAGNOSIS — M75122 Complete rotator cuff tear or rupture of left shoulder, not specified as traumatic: Secondary | ICD-10-CM | POA: Diagnosis not present

## 2024-09-30 DIAGNOSIS — R531 Weakness: Secondary | ICD-10-CM | POA: Diagnosis not present

## 2024-10-05 DIAGNOSIS — R531 Weakness: Secondary | ICD-10-CM | POA: Diagnosis not present

## 2024-10-07 DIAGNOSIS — Z8352 Family history of ear disorders: Secondary | ICD-10-CM | POA: Diagnosis not present

## 2024-10-07 DIAGNOSIS — H938X3 Other specified disorders of ear, bilateral: Secondary | ICD-10-CM | POA: Diagnosis not present

## 2024-10-12 DIAGNOSIS — Z9889 Other specified postprocedural states: Secondary | ICD-10-CM | POA: Diagnosis not present

## 2024-10-13 DIAGNOSIS — M9902 Segmental and somatic dysfunction of thoracic region: Secondary | ICD-10-CM | POA: Diagnosis not present

## 2024-10-13 DIAGNOSIS — M9901 Segmental and somatic dysfunction of cervical region: Secondary | ICD-10-CM | POA: Diagnosis not present

## 2024-10-13 DIAGNOSIS — M9905 Segmental and somatic dysfunction of pelvic region: Secondary | ICD-10-CM | POA: Diagnosis not present

## 2024-10-13 DIAGNOSIS — M9903 Segmental and somatic dysfunction of lumbar region: Secondary | ICD-10-CM | POA: Diagnosis not present

## 2024-10-13 DIAGNOSIS — M9908 Segmental and somatic dysfunction of rib cage: Secondary | ICD-10-CM | POA: Diagnosis not present

## 2024-10-13 DIAGNOSIS — R519 Headache, unspecified: Secondary | ICD-10-CM | POA: Diagnosis not present

## 2024-10-14 DIAGNOSIS — M75122 Complete rotator cuff tear or rupture of left shoulder, not specified as traumatic: Secondary | ICD-10-CM | POA: Diagnosis not present

## 2024-10-14 DIAGNOSIS — R531 Weakness: Secondary | ICD-10-CM | POA: Diagnosis not present

## 2024-10-18 DIAGNOSIS — M9905 Segmental and somatic dysfunction of pelvic region: Secondary | ICD-10-CM | POA: Diagnosis not present

## 2024-10-18 DIAGNOSIS — M9903 Segmental and somatic dysfunction of lumbar region: Secondary | ICD-10-CM | POA: Diagnosis not present

## 2024-10-18 DIAGNOSIS — M9901 Segmental and somatic dysfunction of cervical region: Secondary | ICD-10-CM | POA: Diagnosis not present

## 2024-10-18 DIAGNOSIS — R519 Headache, unspecified: Secondary | ICD-10-CM | POA: Diagnosis not present

## 2024-10-18 DIAGNOSIS — M9908 Segmental and somatic dysfunction of rib cage: Secondary | ICD-10-CM | POA: Diagnosis not present

## 2024-10-18 DIAGNOSIS — M9902 Segmental and somatic dysfunction of thoracic region: Secondary | ICD-10-CM | POA: Diagnosis not present

## 2024-10-19 DIAGNOSIS — R531 Weakness: Secondary | ICD-10-CM | POA: Diagnosis not present

## 2024-10-19 DIAGNOSIS — M75122 Complete rotator cuff tear or rupture of left shoulder, not specified as traumatic: Secondary | ICD-10-CM | POA: Diagnosis not present

## 2024-10-20 DIAGNOSIS — M9901 Segmental and somatic dysfunction of cervical region: Secondary | ICD-10-CM | POA: Diagnosis not present

## 2024-10-20 DIAGNOSIS — M9908 Segmental and somatic dysfunction of rib cage: Secondary | ICD-10-CM | POA: Diagnosis not present

## 2024-10-20 DIAGNOSIS — M9903 Segmental and somatic dysfunction of lumbar region: Secondary | ICD-10-CM | POA: Diagnosis not present

## 2024-10-20 DIAGNOSIS — M9902 Segmental and somatic dysfunction of thoracic region: Secondary | ICD-10-CM | POA: Diagnosis not present

## 2024-10-20 DIAGNOSIS — M9905 Segmental and somatic dysfunction of pelvic region: Secondary | ICD-10-CM | POA: Diagnosis not present

## 2024-10-20 DIAGNOSIS — R519 Headache, unspecified: Secondary | ICD-10-CM | POA: Diagnosis not present

## 2024-10-22 DIAGNOSIS — M75122 Complete rotator cuff tear or rupture of left shoulder, not specified as traumatic: Secondary | ICD-10-CM | POA: Diagnosis not present

## 2024-10-22 DIAGNOSIS — R531 Weakness: Secondary | ICD-10-CM | POA: Diagnosis not present

## 2024-10-25 DIAGNOSIS — M9901 Segmental and somatic dysfunction of cervical region: Secondary | ICD-10-CM | POA: Diagnosis not present

## 2024-10-25 DIAGNOSIS — R519 Headache, unspecified: Secondary | ICD-10-CM | POA: Diagnosis not present

## 2024-10-25 DIAGNOSIS — M9903 Segmental and somatic dysfunction of lumbar region: Secondary | ICD-10-CM | POA: Diagnosis not present

## 2024-10-25 DIAGNOSIS — M9902 Segmental and somatic dysfunction of thoracic region: Secondary | ICD-10-CM | POA: Diagnosis not present

## 2024-10-25 DIAGNOSIS — M9908 Segmental and somatic dysfunction of rib cage: Secondary | ICD-10-CM | POA: Diagnosis not present

## 2024-10-25 DIAGNOSIS — M9905 Segmental and somatic dysfunction of pelvic region: Secondary | ICD-10-CM | POA: Diagnosis not present

## 2024-10-26 DIAGNOSIS — R531 Weakness: Secondary | ICD-10-CM | POA: Diagnosis not present

## 2024-10-26 DIAGNOSIS — M75122 Complete rotator cuff tear or rupture of left shoulder, not specified as traumatic: Secondary | ICD-10-CM | POA: Diagnosis not present

## 2024-11-01 DIAGNOSIS — R531 Weakness: Secondary | ICD-10-CM | POA: Diagnosis not present

## 2024-11-01 DIAGNOSIS — M75122 Complete rotator cuff tear or rupture of left shoulder, not specified as traumatic: Secondary | ICD-10-CM | POA: Diagnosis not present

## 2024-11-04 DIAGNOSIS — Z1329 Encounter for screening for other suspected endocrine disorder: Secondary | ICD-10-CM | POA: Diagnosis not present

## 2024-11-04 DIAGNOSIS — E559 Vitamin D deficiency, unspecified: Secondary | ICD-10-CM | POA: Diagnosis not present

## 2024-11-04 DIAGNOSIS — E538 Deficiency of other specified B group vitamins: Secondary | ICD-10-CM | POA: Diagnosis not present

## 2024-11-04 DIAGNOSIS — G4700 Insomnia, unspecified: Secondary | ICD-10-CM | POA: Diagnosis not present

## 2024-11-04 DIAGNOSIS — R5383 Other fatigue: Secondary | ICD-10-CM | POA: Diagnosis not present

## 2024-11-04 DIAGNOSIS — R109 Unspecified abdominal pain: Secondary | ICD-10-CM | POA: Diagnosis not present

## 2024-11-04 DIAGNOSIS — K5900 Constipation, unspecified: Secondary | ICD-10-CM | POA: Diagnosis not present

## 2024-11-09 DIAGNOSIS — M9903 Segmental and somatic dysfunction of lumbar region: Secondary | ICD-10-CM | POA: Diagnosis not present

## 2024-11-09 DIAGNOSIS — R519 Headache, unspecified: Secondary | ICD-10-CM | POA: Diagnosis not present

## 2024-11-09 DIAGNOSIS — M9908 Segmental and somatic dysfunction of rib cage: Secondary | ICD-10-CM | POA: Diagnosis not present

## 2024-11-09 DIAGNOSIS — M9901 Segmental and somatic dysfunction of cervical region: Secondary | ICD-10-CM | POA: Diagnosis not present

## 2024-11-09 DIAGNOSIS — M9902 Segmental and somatic dysfunction of thoracic region: Secondary | ICD-10-CM | POA: Diagnosis not present

## 2024-11-09 DIAGNOSIS — M9905 Segmental and somatic dysfunction of pelvic region: Secondary | ICD-10-CM | POA: Diagnosis not present

## 2024-11-10 DIAGNOSIS — R531 Weakness: Secondary | ICD-10-CM | POA: Diagnosis not present

## 2024-11-10 DIAGNOSIS — M75122 Complete rotator cuff tear or rupture of left shoulder, not specified as traumatic: Secondary | ICD-10-CM | POA: Diagnosis not present

## 2024-11-16 DIAGNOSIS — M9902 Segmental and somatic dysfunction of thoracic region: Secondary | ICD-10-CM | POA: Diagnosis not present

## 2024-11-16 DIAGNOSIS — R519 Headache, unspecified: Secondary | ICD-10-CM | POA: Diagnosis not present

## 2024-11-16 DIAGNOSIS — M9905 Segmental and somatic dysfunction of pelvic region: Secondary | ICD-10-CM | POA: Diagnosis not present

## 2024-11-16 DIAGNOSIS — M9901 Segmental and somatic dysfunction of cervical region: Secondary | ICD-10-CM | POA: Diagnosis not present

## 2024-11-16 DIAGNOSIS — M9908 Segmental and somatic dysfunction of rib cage: Secondary | ICD-10-CM | POA: Diagnosis not present

## 2024-11-16 DIAGNOSIS — M9903 Segmental and somatic dysfunction of lumbar region: Secondary | ICD-10-CM | POA: Diagnosis not present

## 2024-11-17 ENCOUNTER — Ambulatory Visit: Admitting: Medical

## 2024-11-17 VITALS — BP 124/80 | HR 87 | Temp 97.9°F | Resp 15 | Ht 68.0 in | Wt 166.2 lb

## 2024-11-17 DIAGNOSIS — R21 Rash and other nonspecific skin eruption: Secondary | ICD-10-CM | POA: Diagnosis not present

## 2024-11-17 DIAGNOSIS — R531 Weakness: Secondary | ICD-10-CM | POA: Diagnosis not present

## 2024-11-17 DIAGNOSIS — M75122 Complete rotator cuff tear or rupture of left shoulder, not specified as traumatic: Secondary | ICD-10-CM | POA: Diagnosis not present

## 2024-11-17 DIAGNOSIS — T7840XA Allergy, unspecified, initial encounter: Secondary | ICD-10-CM

## 2024-11-17 MED ORDER — METHYLPREDNISOLONE 4 MG PO TABS: ORAL_TABLET | ORAL | 0 refills | Status: AC

## 2024-11-17 MED ORDER — TRIAMCINOLONE ACETONIDE 0.1 % EX CREA: 1.0000 | TOPICAL_CREAM | Freq: Two times a day (BID) | CUTANEOUS | 0 refills | Status: AC

## 2024-11-17 NOTE — Progress Notes (Signed)
 Subjective:    Patient ID: Rachel Vargas, female    DOB: 23-Feb-2006, 18 y.o.   MRN: [id number]  HPI Rachel Vargas is an 18 year old female who presents with an itchy rash on her neck and arm.  She developed an itchy rash on her neck 2 to 3 days ago, with similar lesions appearing on her arm yesterday. The itch is persistent, most bothersome on her neck, and overall moderate but can intensify, with no significant change in severity since onset. She has no prior similar rashes or eczema, no new lotions, soaps, detergents, or insect bites, and no fevers, chills, sweats, body aches, or sore throat. Benadryl gave partial relief and helped her sleep, but the pruritus continues. She is about to go on a [place] cruise with expected sun exposure.      Review of Systems  Constitutional:  Negative for chills, fatigue and fever.  Respiratory:  Negative for chest tightness, shortness of breath and wheezing.   Cardiovascular:  Negative for chest pain and palpitations.  Gastrointestinal:  Negative for abdominal pain.  Skin:  Positive for rash.  Neurological:  Negative for dizziness.  Hematological:  Negative for adenopathy.  Psychiatric/Behavioral:  Negative for behavioral problems and decreased concentration. The patient is not nervous/anxious.      Past Medical History:  Diagnosis Date   Allergy    Frequent headaches 05/30/2023   Vision abnormalities    Wears corrective lenses     Social History   Socioeconomic History   Marital status: Single    Spouse name: Not on file   Number of children: Not on file   Years of education: Not on file   Highest education level: Not on file  Occupational History   Not on file  Tobacco Use   Smoking status: Never   Smokeless tobacco: Never  Vaping Use   Vaping status: Never Used  Substance and Sexual Activity   Alcohol use: No    Alcohol/week: 0.0 standard drinks of alcohol   Drug use: No   Sexual activity: Never  Other Topics  Concern   Not on file  Social History Narrative   Caffiene no   Middle child of 3, llives with parents   Basket ball at [employer]   Social Drivers of Health   Tobacco Use: Low Risk (05/14/2024)   Patient History    Smoking Tobacco Use: Never    Smokeless Tobacco Use: Never    Passive Exposure: Not on file  Financial Resource Strain: Not on file  Food Insecurity: Not on file  Transportation Needs: Not on file  Physical Activity: Not on file  Stress: Not on file  Social Connections: Unknown (04/12/2022)   Received from [facility]   Social Network    Social Network: Not on file  Intimate Partner Violence: Unknown (03/05/2022)   Received from [facility]   HITS    Physically Hurt: Not on file    Insult or Talk Down To: Not on file    Threaten Physical Harm: Not on file    Scream or Curse: Not on file  Depression (PHQ2-9): Low Risk (11/17/2024)   Depression (PHQ2-9)    PHQ-2 Score: 3  Alcohol Screen: Not on file  Housing: Not on file  Utilities: Not on file  Health Literacy: Not on file    Past Surgical History:  Procedure Laterality Date   BANKART REPAIR Left 05/14/2024   Procedure: REPAIR, SHOULDER, ARTHROSCOPIC, BANKART, REMPLISSAGE;  Surgeon: Sharl Selinda Dover, MD;  Location: [facility];  Service: Orthopedics;  Laterality: Left;    Family History  Problem Relation Age of Onset   Asthma Mother        as a child   Hypertension Maternal Grandmother    Hypertension Maternal Grandfather    Hypertension Paternal Grandfather    Migraines Neg Hx     Allergies[1]  Medications Ordered Prior to Encounter[2]  BP 124/80   Pulse 87   Temp 97.9 F (36.6 C) (Oral)   Resp 15   Ht 5' 8 (1.727 m)   Wt 166 lb 3.2 oz (75.4 kg)   LMP 11/04/2024 (Approximate)   SpO2 100%   BMI 25.27 kg/m          Objective:   Physical Exam  General- No acute distress. Pleasant patient. Neck- Full range of motion, no jvd Lungs- Clear, even and unlabored. Heart- regular  rate and rhythm. Neurologic- CNII- XII grossly intact.  Derm- mild rash anterior neck more visible on rt side. Rt arm faint rash lateral upper arm.      Assessment & Plan:   Pruritic rash of the neck and arm(suspect allergic reaction with some potential dry skin component during winter) Pruritic rash on neck and arm. Benadryl provided partial relief. Considering oral steroids due to left side involvement. - Prescribed topical triamcinolone  cream twice daily. - Prescribed tapered Medrol  dose pack if rash worsens or does not improve to some degree by Friday. - Advised use of moisturizers like Lubraderm or Aveeno or palmers to area mid day,  - Explained  potential oral steroid side effects. - Advised against driving while sedated by Benadryl. - Advised sunscreen use on area where triamcinolone  applied while on cruise.  follow up if needed post cruise.  Dallas Maxwell, PA-C     [1]  Allergies Allergen Reactions   Molds & Smuts Other (See Comments)    Per mother   Pollen Extract Other (See Comments)    Per mother   Wheat Extract Other (See Comments)  [2]  Current Outpatient Medications on File Prior to Visit  Medication Sig Dispense Refill   acetaminophen (TYLENOL) 500 MG tablet Take 500 mg by mouth every 6 (six) hours as needed (pain).     clindamycin (CLINDAGEL) 1 % gel Apply 1 Application topically in the morning.     ibuprofen  (ADVIL ) 200 MG tablet Take 200-400 mg by mouth every 8 (eight) hours as needed (pain/headache.).     ondansetron  (ZOFRAN -ODT) 4 MG disintegrating tablet Take 1 tablet (4 mg total) by mouth every 8 (eight) hours as needed for nausea or vomiting. 20 tablet 0   oxyCODONE  (ROXICODONE ) 5 MG immediate release tablet Take 1 tablet (5 mg total) by mouth every 4 (four) hours as needed for moderate pain (pain score 4-6) or severe pain (pain score 7-10). 22 tablet 0   tretinoin (RETIN-A) 0.025 % cream Apply 1 application  topically at bedtime.     No current  facility-administered medications on file prior to visit.

## 2024-11-17 NOTE — Patient Instructions (Signed)
 Pruritic rash of the neck and arm(suspect allergic reaction with some potential dry skin component during winter) Pruritic rash on neck and arm. Benadryl provided partial relief. Considering oral steroids due to left side involvement. - Prescribed topical triamcinolone  cream twice daily. - Prescribed tapered Medrol  dose pack if rash worsens or does not improve to some degree by Friday. - Advised use of moisturizers like Lubraderm or Aveeno or palmers to area mid day,  - Explained  potential oral steroid side effects. - Advised against driving while sedated by Benadryl. - Advised sunscreen use on area where triamcinolone  applied while on cruise.  follow up if needed post cruise.
